# Patient Record
Sex: Male | Born: 1945 | Race: Black or African American | Hispanic: No | Marital: Married | Smoking: Former smoker
Health system: Southern US, Community
[De-identification: ages and names within clinical notes are randomized; demographics above are authoritative.]

## PROBLEM LIST (undated history)

## (undated) DIAGNOSIS — C801 Malignant (primary) neoplasm, unspecified: Secondary | ICD-10-CM

## (undated) DIAGNOSIS — R42 Dizziness and giddiness: Secondary | ICD-10-CM

## (undated) DIAGNOSIS — I10 Essential (primary) hypertension: Secondary | ICD-10-CM

---

## 2019-04-12 ENCOUNTER — Encounter: Payer: Self-pay | Admitting: *Deleted

## 2019-05-03 ENCOUNTER — Other Ambulatory Visit: Payer: Self-pay

## 2019-05-03 ENCOUNTER — Ambulatory Visit (INDEPENDENT_AMBULATORY_CARE_PROVIDER_SITE_OTHER): Payer: Self-pay | Admitting: *Deleted

## 2019-05-03 DIAGNOSIS — Z1211 Encounter for screening for malignant neoplasm of colon: Secondary | ICD-10-CM

## 2019-05-03 MED ORDER — PEG 3350-KCL-NA BICARB-NACL 420 G PO SOLR: 4000.0000 mL | Freq: Once | ORAL | 0 refills | Status: AC

## 2019-05-03 NOTE — Patient Instructions (Signed)
Joseph Norman   Nov 12, 1945 MRN: [id number]    Procedure Date: 06/30/2019 Time to register: 11:00 am Place to register: [facility] Procedure Time: 12:00 pm Scheduled provider: Dr. Gala Romney  PREPARATION FOR COLONOSCOPY WITH TRI-LYTE SPLIT PREP  Please notify us immediately if you are diabetic, take iron supplements, or if you are on Coumadin or any other blood thinners.   You will need to purchase 1 fleet enema and 1 box of Bisacodyl '5mg'$  tablets.   2 DAYS BEFORE PROCEDURE:  DATE: 06/28/2019   DAY: Monday Begin clear liquid diet AFTER your lunch meal. NO SOLID FOODS after this point.  1 DAY BEFORE PROCEDURE:  DATE: 06/29/2019  DAY: Tuesday Continue clear liquids the entire day - NO SOLID FOOD.    At 2:00 pm:  Take 2 Bisacodyl tablets.   At 4:00pm:  Start drinking your solution. Make sure you mix well per instructions on the bottle. Try to drink 1 (one) 8 ounce glass every 10-15 minutes until you have consumed HALF the jug. You should complete by 6:00pm.You must keep the left over solution refrigerated until completed next day.  Continue clear liquids. You must drink plenty of clear liquids to prevent dehyration and kidney failure.     DAY OF PROCEDURE:   DATE: 06/30/2019   DAY: Wednesday If you take medications for your heart, blood pressure or breathing, you may take these medications.    Five hours before your procedure time @ 7:00 am:  Finish remaining amout of bowel prep, drinking 1 (one) 8 ounce glass every 10-15 minutes until complete. You have two hours to consume remaining prep.   Three hours before your procedure time @ 9:00 am:  Nothing by mouth.   At least one hour before going to the hospital:  Give yourself one Fleet enema. You may take your morning medications with sip of water unless we have instructed otherwise.      Please see below for Dietary Information.  CLEAR LIQUIDS INCLUDE:  Water Jello (NOT red in color)   Ice Popsicles (NOT red in color)    Tea (sugar ok, no milk/cream) Powdered fruit flavored drinks  Coffee (sugar ok, no milk/cream) Gatorade/ Lemonade/ Kool-Aid  (NOT red in color)   Juice: apple, white grape, white cranberry Soft drinks  Clear bullion, consomme, broth (fat free beef/chicken/vegetable)  Carbonated beverages (any kind)  Strained chicken noodle soup Hard Candy   Remember: Clear liquids are liquids that will allow you to see your fingers on the other side of a clear glass. Be sure liquids are NOT red in color, and not cloudy, but CLEAR.  DO NOT EAT OR DRINK ANY OF THE FOLLOWING:  Dairy products of any kind   Cranberry juice Tomato juice / V8 juice   Grapefruit juice Orange juice     Red grape juice  Do not eat any solid foods, including such foods as: cereal, oatmeal, yogurt, fruits, vegetables, creamed soups, eggs, bread, crackers, pureed foods in a blender, etc.   HELPFUL HINTS FOR DRINKING PREP SOLUTION:   Make sure prep is extremely cold. Mix and refrigerate the the morning of the prep. You may also put in the freezer.   You may try mixing some Crystal Light or Country Time Lemonade if you prefer. Mix in small amounts; add more if necessary.  Try drinking through a straw  Rinse mouth with water or a mouthwash between glasses, to remove after-taste.  Try sipping on a cold beverage /ice/ popsicles between glasses of  prep.  Place a piece of sugar-free hard candy in mouth between glasses.  If you become nauseated, try consuming smaller amounts, or stretch out the time between glasses. Stop for 30-60 minutes, then slowly start back drinking.        OTHER INSTRUCTIONS  You will need a responsible adult at least 74 years of age to accompany you and drive you home. This person must remain in the waiting room during your procedure. The hospital will cancel your procedure if you do not have a responsible adult with you.   1. Wear loose fitting clothing that is easily removed. 2. Leave jewelry and  other valuables at home.  3. Remove all body piercing jewelry and leave at home. 4. Total time from sign-in until discharge is approximately 2-3 hours. 5. You should go home directly after your procedure and rest. You can resume normal activities the day after your procedure. 6. The day of your procedure you should not:  Drive  Make legal decisions  Operate machinery  Drink alcohol  Return to work   You may call the office (Dept: [phone]) before 5:00pm, or page the doctor on call ([phone]) after 5:00pm, for further instructions, if necessary.   Insurance Information YOU WILL NEED TO CHECK WITH YOUR INSURANCE COMPANY FOR THE BENEFITS OF COVERAGE YOU HAVE FOR THIS PROCEDURE.  UNFORTUNATELY, NOT ALL INSURANCE COMPANIES HAVE BENEFITS TO COVER ALL OR PART OF THESE TYPES OF PROCEDURES.  IT IS YOUR RESPONSIBILITY TO CHECK YOUR BENEFITS, HOWEVER, WE WILL BE GLAD TO ASSIST YOU WITH ANY CODES YOUR INSURANCE COMPANY MAY NEED.    PLEASE NOTE THAT MOST INSURANCE COMPANIES WILL NOT COVER A SCREENING COLONOSCOPY FOR PEOPLE UNDER THE AGE OF 50  IF YOU HAVE BCBS INSURANCE, YOU MAY HAVE BENEFITS FOR A SCREENING COLONOSCOPY BUT IF POLYPS ARE FOUND THE DIAGNOSIS WILL CHANGE AND THEN YOU MAY HAVE A DEDUCTIBLE THAT WILL NEED TO BE MET. SO PLEASE MAKE SURE YOU CHECK YOUR BENEFITS FOR A SCREENING COLONOSCOPY AS WELL AS A DIAGNOSTIC COLONOSCOPY.  

## 2019-05-03 NOTE — Progress Notes (Addendum)
Gastroenterology Pre-Procedure Review  Request Date: 05/03/2019 Requesting Physician: Dr. Roanna Epley Zhou-Talbert @ [facility], Last TCS 10 years or more done in [place], no polyps per pt  PATIENT REVIEW QUESTIONS: The patient responded to the following health history questions as indicated:    1. Diabetes Melitis: no 2. Joint replacements in the past 12 months: no 3. Major health problems in the past 3 months: no 4. Has an artificial valve or MVP: no 5. Has a defibrillator: no 6. Has been advised in past to take antibiotics in advance of a procedure like teeth cleaning: no 7. Family history of colon cancer: no 8. Alcohol Use: no 9. Illicit drug Use: no 10. History of sleep apnea: no  11. History of coronary artery or other vascular stents placed within the last 12 months: no 12. History of any prior anesthesia complications: no 13. There is no height or weight on file to calculate BMI. ht: 6'1 wt: 220 lbs    MEDICATIONS & ALLERGIES:    Patient reports the following regarding taking any blood thinners:   Plavix? no Aspirin? no Coumadin? no Brilinta? no Xarelto? no Eliquis? no Pradaxa? no Savaysa? no Effient? no  Patient confirms/reports the following medications:  Current Outpatient Medications  Medication Sig Dispense Refill  . Cholecalciferol (VITAMIN D3) 50 MCG (2000 UT) TABS Take by mouth.    . meclizine (ANTIVERT) 25 MG tablet Take 25 mg by mouth as needed for dizziness.    . metoprolol tartrate (LOPRESSOR) 50 MG tablet Take 50 mg by mouth daily. Takes 1/2 tablet daily.    . Omega-3 Fatty Acids (FISH OIL) 1200 MG CAPS Take by mouth daily.    . polyethylene glycol (MIRALAX / GLYCOLAX) 17 g packet Take 17 g by mouth as needed.    . Cholecalciferol (VITAMIN D) 50 MCG (2000 UT) tablet Take 1,200 Units by mouth daily.     No current facility-administered medications for this visit.     Patient confirms/reports the following allergies:  Allergies   Allergen Reactions  . Asa [Aspirin] Other (See Comments)    Stomach pain    No orders of the defined types were placed in this encounter.   AUTHORIZATION INFORMATION Primary Insurance: [employer] Medicare,  ID [id number] ,  Group #: [id number] Pre-Cert / Auth required: No, not required  SCHEDULE INFORMATION: Procedure has been scheduled as follows:  Date: 06/30/2019, Time: 12:00 Location: [facility] with Dr. Gala Romney  This Gastroenterology Pre-Precedure Review Form is being routed to the following provider(s): Neil Crouch, PA-C

## 2019-05-04 NOTE — Progress Notes (Signed)
Ok to schedule.

## 2019-05-05 NOTE — Addendum Note (Signed)
Addended by: Metro Kung on: 05/05/2019 07:32 AM   Modules accepted: Orders, SmartSet

## 2019-06-28 ENCOUNTER — Other Ambulatory Visit (HOSPITAL_COMMUNITY)
Admission: RE | Admit: 2019-06-28 | Discharge: 2019-06-28 | Disposition: A | Discharge status: Home / Self Care | Payer: Medicare Other | Source: Ambulatory Visit | Attending: Internal Medicine | Admitting: Internal Medicine

## 2019-06-28 ENCOUNTER — Other Ambulatory Visit: Payer: Self-pay

## 2019-06-28 DIAGNOSIS — Z20828 Contact with and (suspected) exposure to other viral communicable diseases: Secondary | ICD-10-CM | POA: Diagnosis not present

## 2019-06-28 DIAGNOSIS — Z01812 Encounter for preprocedural laboratory examination: Secondary | ICD-10-CM | POA: Diagnosis present

## 2019-06-28 LAB — SARS CORONAVIRUS 2 (TAT 6-24 HRS): SARS Coronavirus 2: NEGATIVE

## 2019-06-30 ENCOUNTER — Encounter (HOSPITAL_COMMUNITY)
Admission: RE | Disposition: A | Discharge status: Home / Self Care | Payer: Self-pay | Source: Home / Self Care | Attending: Internal Medicine

## 2019-06-30 ENCOUNTER — Other Ambulatory Visit: Payer: Self-pay

## 2019-06-30 ENCOUNTER — Encounter (HOSPITAL_COMMUNITY): Payer: Self-pay | Admitting: *Deleted

## 2019-06-30 ENCOUNTER — Ambulatory Visit (HOSPITAL_COMMUNITY)
Admission: RE | Admit: 2019-06-30 | Discharge: 2019-06-30 | Disposition: A | Discharge status: Home / Self Care | Payer: Medicare Other | Attending: Internal Medicine | Admitting: Internal Medicine

## 2019-06-30 DIAGNOSIS — K5730 Diverticulosis of large intestine without perforation or abscess without bleeding: Secondary | ICD-10-CM | POA: Insufficient documentation

## 2019-06-30 DIAGNOSIS — I10 Essential (primary) hypertension: Secondary | ICD-10-CM | POA: Diagnosis not present

## 2019-06-30 DIAGNOSIS — Z8546 Personal history of malignant neoplasm of prostate: Secondary | ICD-10-CM | POA: Diagnosis not present

## 2019-06-30 DIAGNOSIS — Z87891 Personal history of nicotine dependence: Secondary | ICD-10-CM | POA: Insufficient documentation

## 2019-06-30 DIAGNOSIS — Z1211 Encounter for screening for malignant neoplasm of colon: Secondary | ICD-10-CM

## 2019-06-30 HISTORY — DX: Dizziness and giddiness: R42

## 2019-06-30 HISTORY — DX: Essential (primary) hypertension: I10

## 2019-06-30 HISTORY — PX: COLONOSCOPY: SHX5424

## 2019-06-30 HISTORY — DX: Malignant (primary) neoplasm, unspecified: C80.1

## 2019-06-30 SURGERY — COLONOSCOPY
Anesthesia: Moderate Sedation

## 2019-06-30 MED ORDER — ONDANSETRON HCL 4 MG/2ML IJ SOLN
INTRAMUSCULAR | Status: DC | PRN
  Administered 2019-06-30: 4 mg via INTRAVENOUS

## 2019-06-30 MED ORDER — MEPERIDINE HCL 50 MG/ML IJ SOLN
INTRAMUSCULAR | Status: AC
  Filled 2019-06-30: qty 1

## 2019-06-30 MED ORDER — MIDAZOLAM HCL 5 MG/5ML IJ SOLN
INTRAMUSCULAR | Status: AC
  Filled 2019-06-30: qty 10

## 2019-06-30 MED ORDER — MEPERIDINE HCL 100 MG/ML IJ SOLN
INTRAMUSCULAR | Status: DC | PRN
  Administered 2019-06-30: 25 mg via INTRAVENOUS

## 2019-06-30 MED ORDER — ONDANSETRON HCL 4 MG/2ML IJ SOLN
INTRAMUSCULAR | Status: AC
  Filled 2019-06-30: qty 2

## 2019-06-30 MED ORDER — MIDAZOLAM HCL 5 MG/5ML IJ SOLN
INTRAMUSCULAR | Status: DC | PRN
  Administered 2019-06-30 (×2): 1 mg via INTRAVENOUS
  Administered 2019-06-30: 2 mg via INTRAVENOUS

## 2019-06-30 MED ORDER — SODIUM CHLORIDE 0.9 % IV SOLN
INTRAVENOUS | Status: DC
  Administered 2019-06-30: 11:00:00 via INTRAVENOUS

## 2019-06-30 NOTE — H&P (Signed)
@LOGO @   Primary Care Physician:  Zhou-Talbert, Elwyn Lade, MD Primary Gastroenterologist:  Dr. Gala Romney  Pre-Procedure History & Physical: HPI:  Joseph Norman is a 73 y.o. male is here for a screening colonoscopy.  Negative colonoscopy reportedly in [place] 10 years ago.  No bowel symptoms.  Past Medical History:  Diagnosis Date  . Cancer [facility])    Prostate  . Dizziness   . Hypertension     Prior to Admission medications   Medication Sig Start Date End Date Taking? Authorizing Provider  acetaminophen (TYLENOL) 500 MG tablet Take 500 mg by mouth every 6 (six) hours as needed (for pain/headaches.).   Yes [provider]  Cholecalciferol (VITAMIN D3) 50 MCG (2000 UT) TABS Take 2,000 Units by mouth daily.    Yes [provider]  hydrocortisone cream 1 % Apply 1 application topically 3 (three) times daily as needed for itching.    Yes [provider]  meclizine (ANTIVERT) 25 MG tablet Take 25 mg by mouth 3 (three) times daily as needed for dizziness.    Yes [provider]  metoprolol tartrate (LOPRESSOR) 50 MG tablet Take 25 mg by mouth daily.    Yes [provider]  Omega-3 Fatty Acids (FISH OIL) 1200 MG CAPS Take 1,200 mg by mouth daily.    Yes [provider]  polyethylene glycol (MIRALAX / GLYCOLAX) 17 g packet Take 17 g by mouth daily as needed (constipation.).    Yes [provider]    Allergies as of 05/05/2019 - never reviewed  Allergen Reaction Noted  . Asa [aspirin] Other (See Comments) 05/03/2019    Family History  Problem Relation Age of Onset  . Colon cancer Neg Hx     Social History   Socioeconomic History  . Marital status: Married    Spouse name: Not on file  . Number of children: Not on file  . Years of education: Not on file  . Highest education level: Not on file  Occupational History  . Not on file  Social Needs  . Financial resource strain: Not on file  . Food insecurity    Worry: Not  on file    Inability: Not on file  . Transportation needs    Medical: Not on file    Non-medical: Not on file  Tobacco Use  . Smoking status: Former Research scientist (life sciences)  . Smokeless tobacco: Never Used  Substance and Sexual Activity  . Alcohol use: Not Currently  . Drug use: Not Currently  . Sexual activity: Not on file  Lifestyle  . Physical activity    Days per week: Not on file    Minutes per session: Not on file  . Stress: Not on file  Relationships  . Social Herbalist on phone: Not on file    Gets together: Not on file    Attends religious service: Not on file    Active member of club or organization: Not on file    Attends meetings of clubs or organizations: Not on file    Relationship status: Not on file  . Intimate partner violence    Fear of current or ex partner: Not on file    Emotionally abused: Not on file    Physically abused: Not on file    Forced sexual activity: Not on file  Other Topics Concern  . Not on file  Social History Narrative  . Not on file    Review of Systems: See HPI, otherwise negative ROS  Physical Exam: There were no vitals taken for this visit. General:   Alert,  Well-developed, well-nourished, pleasant and cooperative in NAD Lungs:  Clear throughout to auscultation.   No wheezes, crackles, or rhonchi. No acute distress. Heart:  Regular rate and rhythm; no murmurs, clicks, rubs,  or gallops. Abdomen:  Soft, nontender and nondistended. No masses, hepatosplenomegaly or hernias noted. Normal bowel sounds, without guarding, and without rebound.    Impression/Plan: Joseph Norman is now here to undergo a screening colonoscopy.  Risks, benefits, limitations, imponderables and alternatives regarding colonoscopy have been reviewed with the patient. Questions have been answered. All parties agreeable.     Notice:  This dictation was prepared with Dragon dictation along with smaller phrase technology. Any transcriptional errors that result  from this process are unintentional and may not be corrected upon review.

## 2019-06-30 NOTE — Discharge Instructions (Signed)
Colonoscopy Discharge Instructions  Read the instructions outlined below and refer to this sheet in the next few weeks. These discharge instructions provide you with general information on caring for yourself after you leave the hospital. Your doctor may also give you specific instructions. While your treatment has been planned according to the most current medical practices available, unavoidable complications occasionally occur. If you have any problems or questions after discharge, call Dr. Gala Romney at [phone]. ACTIVITY  You may resume your regular activity, but move at a slower pace for the next 24 hours.   Take frequent rest periods for the next 24 hours.   Walking will help get rid of the air and reduce the bloated feeling in your belly (abdomen).   No driving for 24 hours (because of the medicine (anesthesia) used during the test).    Do not sign any important legal documents or operate any machinery for 24 hours (because of the anesthesia used during the test).  NUTRITION  Drink plenty of fluids.   You may resume your normal diet as instructed by your doctor.   Begin with a light meal and progress to your normal diet. Heavy or fried foods are harder to digest and may make you feel sick to your stomach (nauseated).   Avoid alcoholic beverages for 24 hours or as instructed.  MEDICATIONS  You may resume your normal medications unless your doctor tells you otherwise.  WHAT YOU CAN EXPECT TODAY  Some feelings of bloating in the abdomen.   Passage of more gas than usual.   Spotting of blood in your stool or on the toilet paper.  IF YOU HAD POLYPS REMOVED DURING THE COLONOSCOPY:  No aspirin products for 7 days or as instructed.   No alcohol for 7 days or as instructed.   Eat a soft diet for the next 24 hours.  FINDING OUT THE RESULTS OF YOUR TEST Not all test results are available during your visit. If your test results are not back during the visit, make an appointment  with your caregiver to find out the results. Do not assume everything is normal if you have not heard from your caregiver or the medical facility. It is important for you to follow up on all of your test results.  SEEK IMMEDIATE MEDICAL ATTENTION IF:  You have more than a spotting of blood in your stool.   Your belly is swollen (abdominal distention).   You are nauseated or vomiting.   You have a temperature over 101.   You have abdominal pain or discomfort that is severe or gets worse throughout the day.   Diverticulosis information provided  I do not recommend a future colonoscopy unless new symptoms develop  At patient's request I called Laython Kirtz, wife, it [phone].  Rolled to voicemail.  I did not leave a message.   Diverticulosis  Diverticulosis is a condition that develops when small pouches (diverticula) form in the wall of the large intestine (colon). The colon is where water is absorbed and stool is formed. The pouches form when the inside layer of the colon pushes through weak spots in the outer layers of the colon. You may have a few pouches or many of them. What are the causes? The cause of this condition is not known. What increases the risk? The following factors may make you more likely to develop this condition:  Being older than age 45. Your risk for this condition increases with age. Diverticulosis is rare among people younger than  age 35. By age 24, many people have it.  Eating a low-fiber diet.  Having frequent constipation.  Being overweight.  Not getting enough exercise.  Smoking.  Taking over-the-counter pain medicines, like aspirin and ibuprofen.  Having a family history of diverticulosis. What are the signs or symptoms? In most people, there are no symptoms of this condition. If you do have symptoms, they may include:  Bloating.  Cramps in the abdomen.  Constipation or diarrhea.  Pain in the lower left side of the abdomen. How  is this diagnosed? This condition is most often diagnosed during an exam for other colon problems. Because diverticulosis usually has no symptoms, it often cannot be diagnosed independently. This condition may be diagnosed by:  Using a flexible scope to examine the colon (colonoscopy).  Taking an X-ray of the colon after dye has been put into the colon (barium enema).  Doing a CT scan. How is this treated? You may not need treatment for this condition if you have never developed an infection related to diverticulosis. If you have had an infection before, treatment may include:  Eating a high-fiber diet. This may include eating more fruits, vegetables, and grains.  Taking a fiber supplement.  Taking a live bacteria supplement (probiotic).  Taking medicine to relax your colon.  Taking antibiotic medicines. Follow these instructions at home:  Drink 6-8 glasses of water or more each day to prevent constipation.  Try not to strain when you have a bowel movement.  If you have had an infection before: ? Eat more fiber as directed by your health care provider or your diet and nutrition specialist (dietitian). ? Take a fiber supplement or probiotic, if your health care provider approves.  Take over-the-counter and prescription medicines only as told by your health care provider.  If you were prescribed an antibiotic, take it as told by your health care provider. Do not stop taking the antibiotic even if you start to feel better.  Keep all follow-up visits as told by your health care provider. This is important. Contact a health care provider if:  You have pain in your abdomen.  You have bloating.  You have cramps.  You have not had a bowel movement in 3 days. Get help right away if:  Your pain gets worse.  Your bloating becomes very bad.  You have a fever or chills, and your symptoms suddenly get worse.  You vomit.  You have bowel movements that are bloody or  black.  You have bleeding from your rectum. Summary  Diverticulosis is a condition that develops when small pouches (diverticula) form in the wall of the large intestine (colon).  You may have a few pouches or many of them.  This condition is most often diagnosed during an exam for other colon problems.  If you have had an infection related to diverticulosis, treatment may include increasing the fiber in your diet, taking supplements, or taking medicines. This information is not intended to replace advice given to you by your health care provider. Make sure you discuss any questions you have with your health care provider. Document Released: 04/11/2004 Document Revised: 06/27/2017 Document Reviewed: 06/03/2016 Elsevier Patient Education  2020 [employer].

## 2019-06-30 NOTE — Op Note (Signed)
[facility] Patient Name: Joseph Norman Procedure Date: 06/30/2019 10:46 AM MRN: [id number] Date of Birth: 01/09/1946 Attending MD: Norvel Richards , MD CSN: [id number] Age: 73 Admit Type: Outpatient Procedure:                Colonoscopy Indications:              Screening for colorectal malignant neoplasm Providers:                Norvel Richards, MD, Charlsie Quest. Theda Sers RN, RN,                            Aram Candela Referring MD:              Medicines:                Midazolam 4 mg IV, Meperidine 25 mg IV Complications:            No immediate complications. Estimated Blood Loss:     Estimated blood loss: none. Procedure:                Pre-Anesthesia Assessment:                           - Prior to the procedure, a History and Physical                            was performed, and patient medications and                            allergies were reviewed. The patient's tolerance of                            previous anesthesia was also reviewed. The risks                            and benefits of the procedure and the sedation                            options and risks were discussed with the patient.                            All questions were answered, and informed consent                            was obtained. Prior Anticoagulants: The patient has                            taken no previous anticoagulant or antiplatelet                            agents. ASA Grade Assessment: II - A patient with                            mild systemic disease. After reviewing the risks  and benefits, the patient was deemed in                            satisfactory condition to undergo the procedure.                           After obtaining informed consent, the colonoscope                            was passed under direct vision. Throughout the                            procedure, the patient's blood pressure, pulse, and                             oxygen saturations were monitored continuously. The                            [id number] scope was introduced through                            the anus and advanced to the the cecum, identified                            by appendiceal orifice and ileocecal valve. The                            colonoscopy was performed without difficulty. The                            patient tolerated the procedure well. The quality                            of the bowel preparation was adequate. Scope In: 11:16:03 AM Scope Out: 11:31:19 AM Scope Withdrawal Time: 0 hours 6 minutes 56 seconds  Total Procedure Duration: 0 hours 15 minutes 16 seconds  Findings:      The perianal and digital rectal examinations were normal.      Scattered medium-mouthed diverticula were found in the ascending colon.       There was no evidence of diverticular bleeding.      The exam was otherwise without abnormality on direct and retroflexion       views. Impression:               - Diverticulosis in the ascending colon. There was                            no evidence of diverticular bleeding.                           - The examination was otherwise normal on direct                            and retroflexion views.                           -  No specimens collected. Moderate Sedation:      Moderate (conscious) sedation was administered by the endoscopy nurse       and supervised by the endoscopist. The following parameters were       monitored: oxygen saturation, heart rate, blood pressure, respiratory       rate, EKG, adequacy of pulmonary ventilation, and response to care.       Total physician intraservice time was 19 minutes. Recommendation:           - Patient has a contact number available for                            emergencies. The signs and symptoms of potential                            delayed complications were discussed with the                            patient. Return to  normal activities tomorrow.                            Written discharge instructions were provided to the                            patient.                           - Resume previous diet.                           - Continue present medications.                           - No repeat colonoscopy due to age.                           - Return to GI clinic PRN. Procedure Code(s):        --- Professional ---                           [phone], Colonoscopy, flexible; diagnostic, including                            collection of specimen(s) by brushing or washing,                            when performed (separate procedure)                           G0500, Moderate sedation services provided by the                            same physician or other qualified health care                            professional performing a gastrointestinal  endoscopic service that sedation supports,                            requiring the presence of an independent trained                            observer to assist in the monitoring of the                            patient's level of consciousness and physiological                            status; initial 15 minutes of intra-service time;                            patient age 63 years or older (additional time may                            be reported with [phone], as appropriate) Diagnosis Code(s):        --- Professional ---                           Z12.11, Encounter for screening for malignant                            neoplasm of colon                           K57.30, Diverticulosis of large intestine without                            perforation or abscess without bleeding CPT copyright 2019 [employer]. All rights reserved. The codes documented in this report are preliminary and upon coder review may  be revised to meet current compliance requirements. Cristopher Estimable. Rourk, MD Norvel Richards,  MD 06/30/2019 11:37:26 AM This report has been signed electronically. Number of Addenda: 0

## 2019-07-02 ENCOUNTER — Encounter (HOSPITAL_COMMUNITY): Payer: Self-pay | Admitting: Internal Medicine

## 2020-12-07 ENCOUNTER — Other Ambulatory Visit: Payer: Self-pay

## 2020-12-07 ENCOUNTER — Emergency Department (HOSPITAL_COMMUNITY)
Admission: EM | Admit: 2020-12-07 | Discharge: 2020-12-07 | Disposition: A | Discharge status: Home / Self Care | Payer: Medicare Other | Attending: Emergency Medicine | Admitting: Emergency Medicine

## 2020-12-07 ENCOUNTER — Encounter (HOSPITAL_COMMUNITY): Payer: Self-pay

## 2020-12-07 DIAGNOSIS — Z87891 Personal history of nicotine dependence: Secondary | ICD-10-CM | POA: Insufficient documentation

## 2020-12-07 DIAGNOSIS — I10 Essential (primary) hypertension: Secondary | ICD-10-CM | POA: Insufficient documentation

## 2020-12-07 DIAGNOSIS — Z8546 Personal history of malignant neoplasm of prostate: Secondary | ICD-10-CM | POA: Insufficient documentation

## 2020-12-07 DIAGNOSIS — M545 Low back pain, unspecified: Secondary | ICD-10-CM | POA: Diagnosis present

## 2020-12-07 DIAGNOSIS — M5440 Lumbago with sciatica, unspecified side: Secondary | ICD-10-CM

## 2020-12-07 DIAGNOSIS — M5441 Lumbago with sciatica, right side: Secondary | ICD-10-CM | POA: Insufficient documentation

## 2020-12-07 DIAGNOSIS — Z79899 Other long term (current) drug therapy: Secondary | ICD-10-CM | POA: Insufficient documentation

## 2020-12-07 MED ORDER — OXYCODONE-ACETAMINOPHEN 5-325 MG PO TABS
1.0000 | ORAL_TABLET | Freq: Once | ORAL | Status: AC
  Administered 2020-12-07: 1 via ORAL
  Filled 2020-12-07: qty 1

## 2020-12-07 MED ORDER — OXYCODONE-ACETAMINOPHEN 5-325 MG PO TABS: 1.0000 | ORAL_TABLET | Freq: Four times a day (QID) | ORAL | 0 refills | Status: AC | PRN

## 2020-12-07 NOTE — ED Notes (Signed)
Pt verbalized he did not take his bp meds today

## 2020-12-07 NOTE — ED Provider Notes (Signed)
[facility] EMERGENCY DEPARTMENT Provider Note   CSN: [id number] Arrival date & time: 12/07/20  1540     History Chief Complaint  Patient presents with  . Back Pain    Joseph Norman is a 75 y.o. male.  Patient complains of low back pain radiating down right leg.  He was started on prednisone and given a shot yesterday.  Patient states the pain was much worse when he got here but by the time he was seen by the provider and pain was only minimal.  The history is provided by the patient and medical records. No language interpreter was used.  Back Pain Location:  Lumbar spine Quality:  Aching Radiates to: Right leg. Pain severity:  Moderate Pain is:  Worse during the day Onset quality:  Sudden Timing:  Constant Progression:  Worsening Chronicity:  New Context: not emotional stress   Relieved by:  Nothing Worsened by:  Nothing Associated symptoms: no abdominal pain, no chest pain and no headaches        Past Medical History:  Diagnosis Date  . Cancer [facility])    Prostate  . Dizziness   . Hypertension     There are no problems to display for this patient.   Past Surgical History:  Procedure Laterality Date  . COLONOSCOPY N/A 06/30/2019   Procedure: COLONOSCOPY;  Surgeon: Daneil Dolin, MD;  Location[facility];  Service: Endoscopy;  Laterality: N/A;  12:00  . HEMORRHOID SURGERY    . NECK SURGERY    . PROSTATE SURGERY         Family History  Problem Relation Age of Onset  . Colon cancer Neg Hx     Social History   Tobacco Use  . Smoking status: Former Research scientist (life sciences)  . Smokeless tobacco: Never Used  Vaping Use  . Vaping Use: Never used  Substance Use Topics  . Alcohol use: Not Currently  . Drug use: Not Currently    Home Medications Prior to Admission medications   Medication Sig Start Date End Date Taking? Authorizing Provider  acetaminophen (TYLENOL) 650 MG CR tablet Take 1,300 mg by mouth every 8 (eight) hours as needed for pain (pain/headache).    Yes [provider]  Cholecalciferol (VITAMIN D3) 50 MCG (2000 UT) TABS Take 2,000 Units by mouth daily.    Yes [provider]  hydrocortisone cream 1 % Apply 1 application topically daily as needed for itching (for chest).   Yes [provider]  meclizine (ANTIVERT) 25 MG tablet Take 25 mg by mouth 3 (three) times daily as needed for dizziness.    Yes [provider]  metoprolol succinate (TOPROL-XL) 25 MG 24 hr tablet Take 1 tablet by mouth daily. 11/07/20  Yes [provider]  Omega-3 Fatty Acids (FISH OIL) 1200 MG CAPS Take 1,200 mg by mouth daily.    Yes [provider]  oxyCODONE-acetaminophen (PERCOCET) 5-325 MG tablet Take 1 tablet by mouth every 6 (six) hours as needed. 12/07/20  Yes Milton Ferguson, MD  polyethylene glycol (MIRALAX / GLYCOLAX) 17 g packet Take 17 g by mouth daily as needed (constipation.).    Yes [provider]  predniSONE (DELTASONE) 20 MG tablet Take 20 mg by mouth 2 (two) times daily. 1 tab twice daily x5 days 12/04/20  Yes [provider]  acetaminophen (TYLENOL) 500 MG tablet Take 1,000 mg by mouth every 6 (six) hours as needed (for pain/headaches.). Patient not taking: Reported on 12/07/2020    [provider]  metoprolol tartrate (LOPRESSOR) 50 MG tablet Take 25 mg by mouth daily.  Patient not taking: No sig reported    [provider]    Allergies    Asa [aspirin]  Review of Systems   Review of Systems  Constitutional: Negative for appetite change and fatigue.  HENT: Negative for congestion, ear discharge and sinus pressure.   Eyes: Negative for discharge.  Respiratory: Negative for cough.   Cardiovascular: Negative for chest pain.  Gastrointestinal: Negative for abdominal pain and diarrhea.  Genitourinary: Negative for frequency and hematuria.  Musculoskeletal: Positive for back pain.  Skin: Negative for rash.  Neurological: Negative for seizures and headaches.   Psychiatric/Behavioral: Negative for hallucinations.    Physical Exam Updated Vital Signs BP 105/70   Pulse (!) 54   Temp 98.3 F (36.8 C) (Oral)   Resp 16   Ht 6' (1.829 m)   Wt 97.5 kg   SpO2 96%   BMI 29.16 kg/m   Physical Exam Vitals and nursing note reviewed.  Constitutional:      Appearance: He is well-developed.  HENT:     Head: Normocephalic.     Nose: Nose normal.  Eyes:     General: No scleral icterus.    Conjunctiva/sclera: Conjunctivae normal.  Neck:     Thyroid: No thyromegaly.  Cardiovascular:     Rate and Rhythm: Normal rate and regular rhythm.     Heart sounds: No murmur heard. No friction rub. No gallop.   Pulmonary:     Breath sounds: No stridor. No wheezing or rales.  Chest:     Chest wall: No tenderness.  Abdominal:     General: There is no distension.     Tenderness: There is no abdominal tenderness. There is no rebound.  Musculoskeletal:     Cervical back: Neck supple.     Comments: Tender lumbar spine with straight leg raise on the right  Lymphadenopathy:     Cervical: No cervical adenopathy.  Skin:    Findings: No erythema or rash.  Neurological:     Mental Status: He is alert and oriented to person, place, and time.     Motor: No abnormal muscle tone.     Coordination: Coordination normal.  Psychiatric:        Behavior: Behavior normal.     ED Results / Procedures / Treatments   Labs (all labs ordered are listed, but only abnormal results are displayed) Labs Reviewed - No data to display  EKG None  Radiology No results found.  Procedures Procedures   Medications Ordered in ED Medications  oxyCODONE-acetaminophen (PERCOCET/ROXICET) 5-325 MG per tablet 1 tablet (1 tablet Oral Given 12/07/20 1205)    ED Course  I have reviewed the triage vital signs and the nursing notes.  Pertinent labs & imaging results that were available during my care of the patient were reviewed by me and considered in my medical decision making  (see chart for details).    MDM Rules/Calculators/A&P                          Patient with sciatica back pain on the right.  He will take his prednisone and is given some pain medicine will follow up with his doctor Final Clinical Impression(s) / ED Diagnoses Final diagnoses:  Acute right-sided low back pain with sciatica, sciatica laterality unspecified    Rx / DC Orders ED Discharge Orders         Ordered  oxyCODONE-acetaminophen (PERCOCET) 5-325 MG tablet  Every 6 hours PRN        12/07/20 1322           Milton Ferguson, MD 12/08/20 1705

## 2020-12-07 NOTE — Discharge Instructions (Addendum)
Make sure you take your prednisone as prescribed and follow-up with your doctor as needed

## 2020-12-07 NOTE — ED Triage Notes (Signed)
Pt presents to ED via [facility] for lower back pain radiating to right leg. Pt received shot on Monday.

## 2020-12-11 ENCOUNTER — Emergency Department (HOSPITAL_COMMUNITY): Payer: Medicare Other

## 2020-12-11 ENCOUNTER — Other Ambulatory Visit: Payer: Self-pay

## 2020-12-11 ENCOUNTER — Encounter (HOSPITAL_COMMUNITY): Payer: Self-pay | Admitting: Emergency Medicine

## 2020-12-11 ENCOUNTER — Emergency Department (HOSPITAL_COMMUNITY)
Admission: EM | Admit: 2020-12-11 | Discharge: 2020-12-11 | Disposition: A | Discharge status: Home / Self Care | Payer: Medicare Other | Attending: Emergency Medicine | Admitting: Emergency Medicine

## 2020-12-11 DIAGNOSIS — Z79899 Other long term (current) drug therapy: Secondary | ICD-10-CM | POA: Diagnosis not present

## 2020-12-11 DIAGNOSIS — M5431 Sciatica, right side: Secondary | ICD-10-CM | POA: Diagnosis not present

## 2020-12-11 DIAGNOSIS — Z87891 Personal history of nicotine dependence: Secondary | ICD-10-CM | POA: Insufficient documentation

## 2020-12-11 DIAGNOSIS — Z8546 Personal history of malignant neoplasm of prostate: Secondary | ICD-10-CM | POA: Diagnosis not present

## 2020-12-11 DIAGNOSIS — I10 Essential (primary) hypertension: Secondary | ICD-10-CM | POA: Insufficient documentation

## 2020-12-11 DIAGNOSIS — R55 Syncope and collapse: Secondary | ICD-10-CM | POA: Diagnosis not present

## 2020-12-11 NOTE — ED Triage Notes (Signed)
Pt to the Ed via [facility] from home with hypertension.  Pt also says he has sciatica pain on the right side and has been treated here for there same.

## 2020-12-11 NOTE — ED Notes (Signed)
Pt ambulated from room to bathroom and back. Pt gait steady . Pt states no dizziness

## 2020-12-11 NOTE — ED Notes (Signed)
Pt left with wife prior to signing e-signature.

## 2020-12-11 NOTE — ED Provider Notes (Signed)
[facility] EMERGENCY DEPARTMENT Provider Note   CSN: [id number] Arrival date & time: 12/11/20  1643     History Chief Complaint  Patient presents with  . Hypertension    Joseph Norman is a 75 y.o. male.  HPI He resents for evaluation of right-sided sciatica symptoms present for about a week, initially treated by PCP with "a shot and some pills."  Additionally seen in the ED, 4 days ago at that time added oxycodone for pain.  He is taking oxycodone without relief of his discomfort.  Today he was sitting on the porch, and noticed that he had some pain in his right buttocks.  At that time he felt hot and sweaty and felt like he would pass out.  This feeling was transient.  He denies bowel or bladder incontinence.  He denies fever, chills, nausea, vomiting, weakness or dizziness.  He is not losing weight.  He has had prostate cancer in the past apparently treated with radiation, and no known metastatic disease.  There are no other known active modifying factors.    Past Medical History:  Diagnosis Date  . Cancer [facility])    Prostate  . Dizziness   . Hypertension     There are no problems to display for this patient.   Past Surgical History:  Procedure Laterality Date  . COLONOSCOPY N/A 06/30/2019   Procedure: COLONOSCOPY;  Surgeon: Daneil Dolin, MD;  Location[facility];  Service: Endoscopy;  Laterality: N/A;  12:00  . HEMORRHOID SURGERY    . NECK SURGERY    . PROSTATE SURGERY         Family History  Problem Relation Age of Onset  . Colon cancer Neg Hx     Social History   Tobacco Use  . Smoking status: Former Research scientist (life sciences)  . Smokeless tobacco: Never Used  Vaping Use  . Vaping Use: Never used  Substance Use Topics  . Alcohol use: Not Currently  . Drug use: Not Currently    Home Medications Prior to Admission medications   Medication Sig Start Date End Date Taking? Authorizing Provider  acetaminophen (TYLENOL) 500 MG tablet Take 1,000 mg by mouth every 6 (six)  hours as needed (for pain/headaches.). Patient not taking: Reported on 12/07/2020    [provider]  acetaminophen (TYLENOL) 650 MG CR tablet Take 1,300 mg by mouth every 8 (eight) hours as needed for pain (pain/headache).    [provider]  Cholecalciferol (VITAMIN D3) 50 MCG (2000 UT) TABS Take 2,000 Units by mouth daily.     [provider]  hydrocortisone cream 1 % Apply 1 application topically daily as needed for itching (for chest).    [provider]  meclizine (ANTIVERT) 25 MG tablet Take 25 mg by mouth 3 (three) times daily as needed for dizziness.     [provider]  metoprolol succinate (TOPROL-XL) 25 MG 24 hr tablet Take 1 tablet by mouth daily. 11/07/20   [provider]  metoprolol tartrate (LOPRESSOR) 50 MG tablet Take 25 mg by mouth daily.  Patient not taking: No sig reported    [provider]  Omega-3 Fatty Acids (FISH OIL) 1200 MG CAPS Take 1,200 mg by mouth daily.     [provider]  oxyCODONE-acetaminophen (PERCOCET) 5-325 MG tablet Take 1 tablet by mouth every 6 (six) hours as needed. 12/07/20   Milton Ferguson, MD  polyethylene glycol (MIRALAX / GLYCOLAX) 17 g packet Take 17 g by mouth daily as needed (constipation.).  [provider]  predniSONE (DELTASONE) 20 MG tablet Take 20 mg by mouth 2 (two) times daily. 1 tab twice daily x5 days 12/04/20   [provider]    Allergies    Asa [aspirin]  Review of Systems   Review of Systems  All other systems reviewed and are negative.   Physical Exam Updated Vital Signs BP (!) 143/87 (BP Location: Right Arm)   Pulse 64   Temp 98 F (36.7 C) (Oral)   Resp 17   Ht 6' (1.829 m)   Wt 97.5 kg   SpO2 100%   BMI 29.16 kg/m   Physical Exam Vitals and nursing note reviewed.  Constitutional:      General: He is not in acute distress.    Appearance: He is well-developed. He is not ill-appearing, toxic-appearing or diaphoretic.  HENT:      Head: Normocephalic and atraumatic.     Right Ear: External ear normal.     Left Ear: External ear normal.  Eyes:     Conjunctiva/sclera: Conjunctivae normal.     Pupils: Pupils are equal, round, and reactive to light.  Neck:     Trachea: Phonation normal.  Cardiovascular:     Rate and Rhythm: Normal rate.  Pulmonary:     Effort: Pulmonary effort is normal.  Abdominal:     General: There is no distension.     Palpations: Abdomen is soft.  Musculoskeletal:        General: Normal range of motion.     Cervical back: Normal range of motion and neck supple.     Comments: Mild tenderness, right lumbar and right buttock region.  No constrained range of motion either actively or passively.  Skin:    General: Skin is warm and dry.  Neurological:     Mental Status: He is alert and oriented to person, place, and time.     Cranial Nerves: No cranial nerve deficit.     Sensory: No sensory deficit.     Motor: No abnormal muscle tone.     Coordination: Coordination normal.  Psychiatric:        Mood and Affect: Mood normal.        Behavior: Behavior normal.        Thought Content: Thought content normal.        Judgment: Judgment normal.     ED Results / Procedures / Treatments   Labs (all labs ordered are listed, but only abnormal results are displayed) Labs Reviewed - No data to display  EKG None  Radiology No results found.  Procedures Procedures   Medications Ordered in ED Medications - No data to display  ED Course  I have reviewed the triage vital signs and the nursing notes.  Pertinent labs & imaging results that were available during my care of the patient were reviewed by me and considered in my medical decision making (see chart for details).    MDM Rules/Calculators/A&P                           Patient Vitals for the past 24 hrs:  BP Temp Temp src Pulse Resp SpO2 Height Weight  12/11/20 2000 118/75 -- -- 62 18 100 % -- --  12/11/20 1900 (!) 142/87 --  -- 64 16 99 % -- --  12/11/20 1800 137/85 -- -- 60 16 97 % -- --  12/11/20 1648 -- -- -- -- -- -- 6' (1.829 m)  97.5 kg  12/11/20 1647 (!) 143/87 98 F (36.7 C) Oral 64 17 100 % -- --    9:43 PM Reevaluation with update and discussion. After initial assessment and treatment, an updated evaluation reveals he is comfortable has had no additional episodes of near syncope.  Patient's wife here now and I explained the findings to the patient and her and all questions were answered. Daleen Bo   Medical Decision Making:  This patient is presenting for evaluation of sciatica and near syncope, which does require a range of treatment options, and is a complaint that involves a moderate risk of morbidity and mortality. The differential diagnoses include vasovagal episode, cardiac disorder, worsening radiculopathy. I decided to review old records, and in summary Ehly male presenting with short period of near-syncope associated with pain full sciatica.  I did not require additional historical information from anyone.    Radiologic Tests Ordered, included x-ray pelvis and lumbar spine.  I independently Visualized: Radiograph images, which show no evidence of lytic lesions/metastatic disease   Critical Interventions-clinical evaluation, radiography  After These Interventions, the Patient was reevaluated and was found improved and stable for discharge.  Patient with signs and symptoms of sciatica.  He had transient near syncope today, likely related to pain.  Screening imaging done with plain films of lumbar spine and pelvis did not indicate lytic lesions, as a source for pain.  Note that he is "recovered," from prostate cancer.  Doubt spinal myelopathy.  Doubt unstable cardiac condition.  Doubt volume depletion or metabolic disorder.  CRITICAL CARE-no Performed by: Daleen Bo  Nursing Notes Reviewed/ Care Coordinated Applicable Imaging Reviewed Interpretation of Laboratory Data incorporated into  ED treatment  The patient appears reasonably screened and/or stabilized for discharge and I doubt any other medical condition or other [facility] requiring further screening, evaluation, or treatment in the ED at this time prior to discharge.  Plan: Home Medications-continue usual; Home Treatments-heat to affected area; return here if the recommended treatment, does not improve the symptoms; Recommended follow up-PCP if not better in 1 week     Final Clinical Impression(s) / ED Diagnoses Final diagnoses:  Sciatica of right side  Near syncope    Rx / DC Orders ED Discharge Orders    None       Daleen Bo, MD 12/12/20 [phone]

## 2020-12-11 NOTE — Discharge Instructions (Addendum)
Continue to treat your sciatica with prednisone or ibuprofen.  Use heat on the sore area 3 or 4 times a day.  The episode that he had earlier today likely was near syncope, a period of dropping blood pressure.  Make sure you are getting plenty of rest, and drink a lot of fluids.  See your doctor if not improving.  Return here if needed.

## 2021-05-31 ENCOUNTER — Other Ambulatory Visit (HOSPITAL_COMMUNITY): Payer: Self-pay | Admitting: Neurology

## 2021-05-31 ENCOUNTER — Other Ambulatory Visit: Payer: Self-pay | Admitting: Neurology

## 2021-05-31 DIAGNOSIS — R29898 Other symptoms and signs involving the musculoskeletal system: Secondary | ICD-10-CM

## 2021-05-31 DIAGNOSIS — G8311 Monoplegia of lower limb affecting right dominant side: Secondary | ICD-10-CM

## 2021-05-31 DIAGNOSIS — M5000 Cervical disc disorder with myelopathy, unspecified cervical region: Secondary | ICD-10-CM

## 2021-06-13 ENCOUNTER — Encounter (HOSPITAL_COMMUNITY): Payer: Self-pay | Admitting: Emergency Medicine

## 2021-06-13 ENCOUNTER — Emergency Department (HOSPITAL_COMMUNITY): Payer: Medicare Other

## 2021-06-13 ENCOUNTER — Emergency Department (HOSPITAL_COMMUNITY)
Admission: EM | Admit: 2021-06-13 | Discharge: 2021-06-13 | Disposition: A | Discharge status: Home / Self Care | Payer: Medicare Other | Attending: Emergency Medicine | Admitting: Emergency Medicine

## 2021-06-13 DIAGNOSIS — Z79899 Other long term (current) drug therapy: Secondary | ICD-10-CM | POA: Insufficient documentation

## 2021-06-13 DIAGNOSIS — M79604 Pain in right leg: Secondary | ICD-10-CM | POA: Insufficient documentation

## 2021-06-13 DIAGNOSIS — Z87891 Personal history of nicotine dependence: Secondary | ICD-10-CM | POA: Insufficient documentation

## 2021-06-13 DIAGNOSIS — I10 Essential (primary) hypertension: Secondary | ICD-10-CM | POA: Insufficient documentation

## 2021-06-13 DIAGNOSIS — R410 Disorientation, unspecified: Secondary | ICD-10-CM | POA: Diagnosis present

## 2021-06-13 DIAGNOSIS — Z8546 Personal history of malignant neoplasm of prostate: Secondary | ICD-10-CM | POA: Diagnosis not present

## 2021-06-13 DIAGNOSIS — K0889 Other specified disorders of teeth and supporting structures: Secondary | ICD-10-CM | POA: Diagnosis not present

## 2021-06-13 LAB — COMPREHENSIVE METABOLIC PANEL
ALT: 13 U/L (ref 0–44)
AST: 18 U/L (ref 15–41)
Albumin: 4.3 g/dL (ref 3.5–5.0)
Alkaline Phosphatase: 47 U/L (ref 38–126)
Anion gap: 12 (ref 5–15)
BUN: 17 mg/dL (ref 8–23)
CO2: 19 mmol/L — ABNORMAL LOW (ref 22–32)
Calcium: 9.1 mg/dL (ref 8.9–10.3)
Chloride: 109 mmol/L (ref 98–111)
Creatinine, Ser: 0.97 mg/dL (ref 0.61–1.24)
GFR, Estimated: >60 mL/min (ref >60)
Glucose, Bld: 92 mg/dL (ref 70–99)
Potassium: 4.1 mmol/L (ref 3.5–5.1)
Sodium: 140 mmol/L (ref 135–145)
Total Bilirubin: 0.7 mg/dL (ref 0.3–1.2)
Total Protein: 7.5 g/dL (ref 6.5–8.1)

## 2021-06-13 LAB — URINALYSIS, ROUTINE W REFLEX MICROSCOPIC
Bacteria, UA: NONE SEEN
Bilirubin Urine: NEGATIVE
Glucose, UA: NEGATIVE mg/dL
Ketones, ur: NEGATIVE mg/dL
Leukocytes,Ua: NEGATIVE
Nitrite: NEGATIVE
Protein, ur: NEGATIVE mg/dL
Specific Gravity, Urine: 1.01 (ref 1.005–1.030)
pH: 5 (ref 5.0–8.0)

## 2021-06-13 LAB — CBC WITH DIFFERENTIAL/PLATELET
Abs Immature Granulocytes: 0.02 10*3/uL (ref 0.00–0.07)
Basophils Absolute: 0.1 10*3/uL (ref 0.0–0.1)
Basophils Relative: 1 %
Eosinophils Absolute: 0.1 10*3/uL (ref 0.0–0.5)
Eosinophils Relative: 3 %
HCT: 39 % (ref 39.0–52.0)
Hemoglobin: 13.1 g/dL (ref 13.0–17.0)
Immature Granulocytes: 0 %
Lymphocytes Relative: 42 %
Lymphs Abs: 2.4 10*3/uL (ref 0.7–4.0)
MCH: 28.2 pg (ref 26.0–34.0)
MCHC: 33.6 g/dL (ref 30.0–36.0)
MCV: 83.9 fL (ref 80.0–100.0)
Monocytes Absolute: 0.5 10*3/uL (ref 0.1–1.0)
Monocytes Relative: 8 %
Neutro Abs: 2.6 10*3/uL (ref 1.7–7.7)
Neutrophils Relative %: 46 %
Platelets: 240 10*3/uL (ref 150–400)
RBC: 4.65 MIL/uL (ref 4.22–5.81)
RDW: 14.2 % (ref 11.5–15.5)
WBC: 5.6 10*3/uL (ref 4.0–10.5)
nRBC: 0 % (ref 0.0–0.2)

## 2021-06-13 NOTE — Discharge Instructions (Signed)
Your work-up was reassuring today.  Have your MRI as planned and follow-up with neurology

## 2021-06-13 NOTE — ED Triage Notes (Signed)
Pt brought in by wife for concerns of sudden confusion. Pt states this confusion started about 3pm and lasted for a while. Pt states he isn't confused but does want to be seen for his toothache.

## 2021-06-14 NOTE — ED Provider Notes (Signed)
[facility] EMERGENCY DEPARTMENT Provider Note   CSN: [id number] Arrival date & time: 06/13/21  1936     History Chief Complaint  Patient presents with   Altered Mental Status    Joseph Norman is a 75 y.o. male.   Altered Mental Status Presenting symptoms: confusion   Associated symptoms: no abdominal pain   Patient was brought in by family member after reported difficulty with memory.  Reportedly had some confusion.  Reportedly had trouble remembering who some people were and what was going on.  Now doing much better but may be a little confusion.  Started at 3 PM.  Reportedly has had some of his other episodes like this.  Patient states he realized he was little confused but feels fine now.  States he has had episodes like this before.  No new medications.  Does have some pain in her right tooth.  States that is also been going on for a long time.  Has pain in his right leg.  Sees Dr. Merlene Laughter on a scheduled for an MRI of his head and neck.  No chest pain.  No trouble breathing.  History of prostate cancer but no intracranial malignancy    Past Medical History:  Diagnosis Date   Cancer [facility])    Prostate   Dizziness    Hypertension     There are no problems to display for this patient.   Past Surgical History:  Procedure Laterality Date   COLONOSCOPY N/A 06/30/2019   Procedure: COLONOSCOPY;  Surgeon: Daneil Dolin, MD;  Location: [facility] END[facility] SUITE;  Service: Endoscopy;  Laterality: N/A;  12:00   HEMORRHOID SURGERY     NECK SURGERY     PROSTATE SURGERY         Family History  Problem Relation Age of Onset   Colon cancer Neg Hx     Social History   Tobacco Use   Smoking status: Former   Smokeless tobacco: Never  Scientific laboratory technician Use: Never used  Substance Use Topics   Alcohol use: Not Currently   Drug use: Not Currently    Home Medications Prior to Admission medications   Medication Sig Start Date End Date Taking? Authorizing Provider  acetaminophen  (TYLENOL) 500 MG tablet Take 1,000 mg by mouth every 6 (six) hours as needed (for pain/headaches.).   Yes [provider]  Cholecalciferol (VITAMIN D3) 50 MCG (2000 UT) TABS Take 2,000 Units by mouth daily.    Yes [provider]  diclofenac Sodium (VOLTAREN) 1 % GEL Apply 2 g topically See admin instructions. Every 6 to 8 hours   Yes [provider]  meclizine (ANTIVERT) 25 MG tablet Take 25 mg by mouth 3 (three) times daily as needed for dizziness.   Yes [provider]  metoprolol succinate (TOPROL-XL) 25 MG 24 hr tablet Take 1 tablet by mouth daily. 11/07/20  Yes [provider]  Omega-3 Fatty Acids (FISH OIL) 1200 MG CAPS Take 1,200 mg by mouth daily.    Yes [provider]  polyethylene glycol (MIRALAX / GLYCOLAX) 17 g packet Take 17 g by mouth daily as needed (constipation.).    Yes [provider]  acetaminophen (TYLENOL) 650 MG CR tablet Take 1,300 mg by mouth every 8 (eight) hours as needed for pain (pain/headache). Patient not taking: Reported on 06/13/2021    [provider]  cetirizine (ZYRTEC) 5 MG tablet cetirizine 5 mg tablet  TAKE 1 TO 2 TABLETS BY MOUTH EVERY DAY  Patient not taking: Reported on 06/13/2021    [provider]  gabapentin (NEURONTIN) 100 MG capsule gabapentin 100 mg capsule Patient not taking: Reported on 06/13/2021 05/29/21   [provider]  hydrocortisone cream 1 % Apply 1 application topically daily as needed for itching (for chest). Patient not taking: Reported on 06/13/2021    [provider]  metoprolol tartrate (LOPRESSOR) 50 MG tablet Take 25 mg by mouth daily.  Patient not taking: Reported on 06/13/2021    [provider]  oxyCODONE-acetaminophen (PERCOCET) 5-325 MG tablet Take 1 tablet by mouth every 6 (six) hours as needed. Patient not taking: Reported on 06/13/2021 12/07/20   Milton Ferguson, MD  predniSONE (DELTASONE) 20 MG tablet Take 20 mg by  mouth 2 (two) times daily. 1 tab twice daily x5 days Patient not taking: No sig reported 12/04/20   [provider]  tamsulosin (FLOMAX) 0.4 MG CAPS capsule tamsulosin 0.4 mg capsule  TAKE 1 CAPSULE BY MOUTH EVERY DAY FOR KIDNEY STONE Patient not taking: Reported on 06/13/2021    [provider]  tiZANidine (ZANAFLEX) 2 MG tablet tizanidine 2 mg tablet  TAKE 1 TABLET BY MOUTH THREE TIMES DAILY FOR 14 DAYS AS NEEDED Patient not taking: Reported on 06/13/2021    [provider]    Allergies    Asa [aspirin] and Tizanidine  Review of Systems   Review of Systems  Constitutional:  Negative for appetite change.  HENT:  Negative for congestion.   Respiratory:  Negative for shortness of breath.   Cardiovascular:  Negative for chest pain.  Gastrointestinal:  Negative for abdominal pain.  Endocrine: Negative for polydipsia.  Genitourinary:  Negative for flank pain.  Musculoskeletal:  Negative for back pain.  Neurological:  Positive for speech difficulty.  Psychiatric/Behavioral:  Positive for confusion.    Physical Exam Updated Vital Signs BP 139/85   Pulse (!) 59   Temp 98.4 F (36.9 C) (Oral)   Resp 18   Ht 6\' 2"  (1.88 m)   Wt 93 kg   SpO2 98%   BMI 26.32 kg/m   Physical Exam Vitals and nursing note reviewed.  HENT:     Head: Atraumatic.     Mouth/Throat:     Mouth: Mucous membranes are moist.     Comments: Mild tenderness over right upper most posterior tooth.  No deformity.  No large cavity.  No fluctuance or jaw. Eyes:     Extraocular Movements: Extraocular movements intact.     Pupils: Pupils are equal, round, and reactive to light.  Cardiovascular:     Rate and Rhythm: Normal rate and regular rhythm.  Pulmonary:     Effort: Pulmonary effort is normal.  Abdominal:     Tenderness: There is no abdominal tenderness.  Musculoskeletal:        General: No tenderness.     Cervical back: Neck supple.  Skin:    General: Skin is warm.      Capillary Refill: Capillary refill takes less than 2 seconds.  Neurological:     Mental Status: He is alert and oriented to person, place, and time.     Cranial Nerves: No cranial nerve deficit.  Psychiatric:        Mood and Affect: Mood normal.    ED Results / Procedures / Treatments   Labs (all labs ordered are listed, but only abnormal results are displayed) Labs Reviewed  URINALYSIS, ROUTINE W REFLEX MICROSCOPIC - Abnormal; Notable for the following components:  Result Value   Hgb urine dipstick MODERATE (*)    All other components within normal limits  COMPREHENSIVE METABOLIC PANEL - Abnormal; Notable for the following components:   CO2 19 (*)    All other components within normal limits  CBC WITH DIFFERENTIAL/PLATELET    EKG None  Radiology CT Head Wo Contrast  Result Date: 06/13/2021 CLINICAL DATA:  Altered mental status and confusion. EXAM: CT HEAD WITHOUT CONTRAST TECHNIQUE: Contiguous axial images were obtained from the base of the skull through the vertex without intravenous contrast. COMPARISON:  None. FINDINGS: Brain: There is mild-to-moderate cerebral atrophy, small vessel disease and atrophic ventriculomegaly, without midline shift. There is slight cerebellar atrophy with otherwise unremarkable cerebellum and brainstem. No focal asymmetry is seen concerning for an acute infarct, hemorrhage or mass. There are benign dural calcifications along the frontal falx. Vascular: There patchy calcifications in the distal vertebral arteries and both siphons, without hyperdense central vessels. Skull: Intact without visible focal lesion. Sinuses/Orbits: There is mild membrane thickening in the ethmoid air cells. 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 Other: None. IMPRESSION: Chronic changes.  No acute intracranial CT findings. Electronically Signed   By: Telford Nab M.D.   On: 06/13/2021 22:43   DG Chest  Portable 1 View  Result Date: 06/13/2021 CLINICAL DATA:  Weakness. EXAM: PORTABLE CHEST 1 VIEW COMPARISON:  None. FINDINGS: The heart size and mediastinal contours are within normal limits. Both lungs are clear. The visualized skeletal structures are unremarkable. IMPRESSION: No active disease. Electronically Signed   By: Ronney Asters M.D.   On: 06/13/2021 20:55    Procedures Procedures   Medications Ordered in ED Medications - No data to display  ED Course  I have reviewed the triage vital signs and the nursing notes.  Pertinent labs & imaging results that were available during my care of the patient were reviewed by me and considered in my medical decision making (see chart for details).    MDM Rules/Calculators/A&P                          Patient brought in for episodic episodes of some memory issues.  At baseline now.  Patient states he may have been a little confused but only had some dental pain now.  Appears to be at baseline.  Work-up reassuring.  Initially MRI had been ordered since it was planned to be done in a couple days anyway.  However could not get it done this time a day.  CT scan done and overall reassuring.  Some chronic changes.  Lab work does not show a reason for the mental status change.  With him being back at baseline and having short-term neurology follow-up think he is stable for discharge. Final Clinical Impression(s) / ED Diagnoses Final diagnoses:  Confusion    Rx / DC Orders ED Discharge Orders     None        Davonna Belling, MD 06/14/21 1404

## 2021-06-15 ENCOUNTER — Ambulatory Visit (HOSPITAL_COMMUNITY)
Admission: RE | Admit: 2021-06-15 | Discharge: 2021-06-15 | Disposition: A | Discharge status: Home / Self Care | Payer: Medicare Other | Source: Ambulatory Visit | Attending: Neurology | Admitting: Neurology

## 2021-06-15 ENCOUNTER — Other Ambulatory Visit: Payer: Self-pay

## 2021-06-15 DIAGNOSIS — R29898 Other symptoms and signs involving the musculoskeletal system: Secondary | ICD-10-CM | POA: Diagnosis present

## 2021-06-15 DIAGNOSIS — M5000 Cervical disc disorder with myelopathy, unspecified cervical region: Secondary | ICD-10-CM

## 2021-06-15 DIAGNOSIS — G8311 Monoplegia of lower limb affecting right dominant side: Secondary | ICD-10-CM | POA: Insufficient documentation

## 2021-07-13 ENCOUNTER — Other Ambulatory Visit: Payer: Self-pay | Admitting: Family Medicine

## 2021-07-13 ENCOUNTER — Other Ambulatory Visit (HOSPITAL_COMMUNITY): Payer: Self-pay | Admitting: Family Medicine

## 2021-07-13 DIAGNOSIS — M25361 Other instability, right knee: Secondary | ICD-10-CM

## 2021-07-25 ENCOUNTER — Ambulatory Visit (HOSPITAL_COMMUNITY)
Admission: RE | Admit: 2021-07-25 | Discharge: 2021-07-25 | Disposition: A | Discharge status: Home / Self Care | Payer: Medicare Other | Source: Ambulatory Visit | Attending: Family Medicine | Admitting: Family Medicine

## 2021-07-25 ENCOUNTER — Other Ambulatory Visit: Payer: Self-pay

## 2021-07-25 DIAGNOSIS — M25361 Other instability, right knee: Secondary | ICD-10-CM | POA: Insufficient documentation

## 2021-08-20 IMAGING — DX DG PELVIS 1-2V
2 series · 2 of 2 positions shown · non-contrast
Comparison: None.

CLINICAL DATA: Chronic lower back pain radiating down the right leg

EXAM:
PELVIS - 1-2 VIEW

[pelvis ap (1 of 2)]
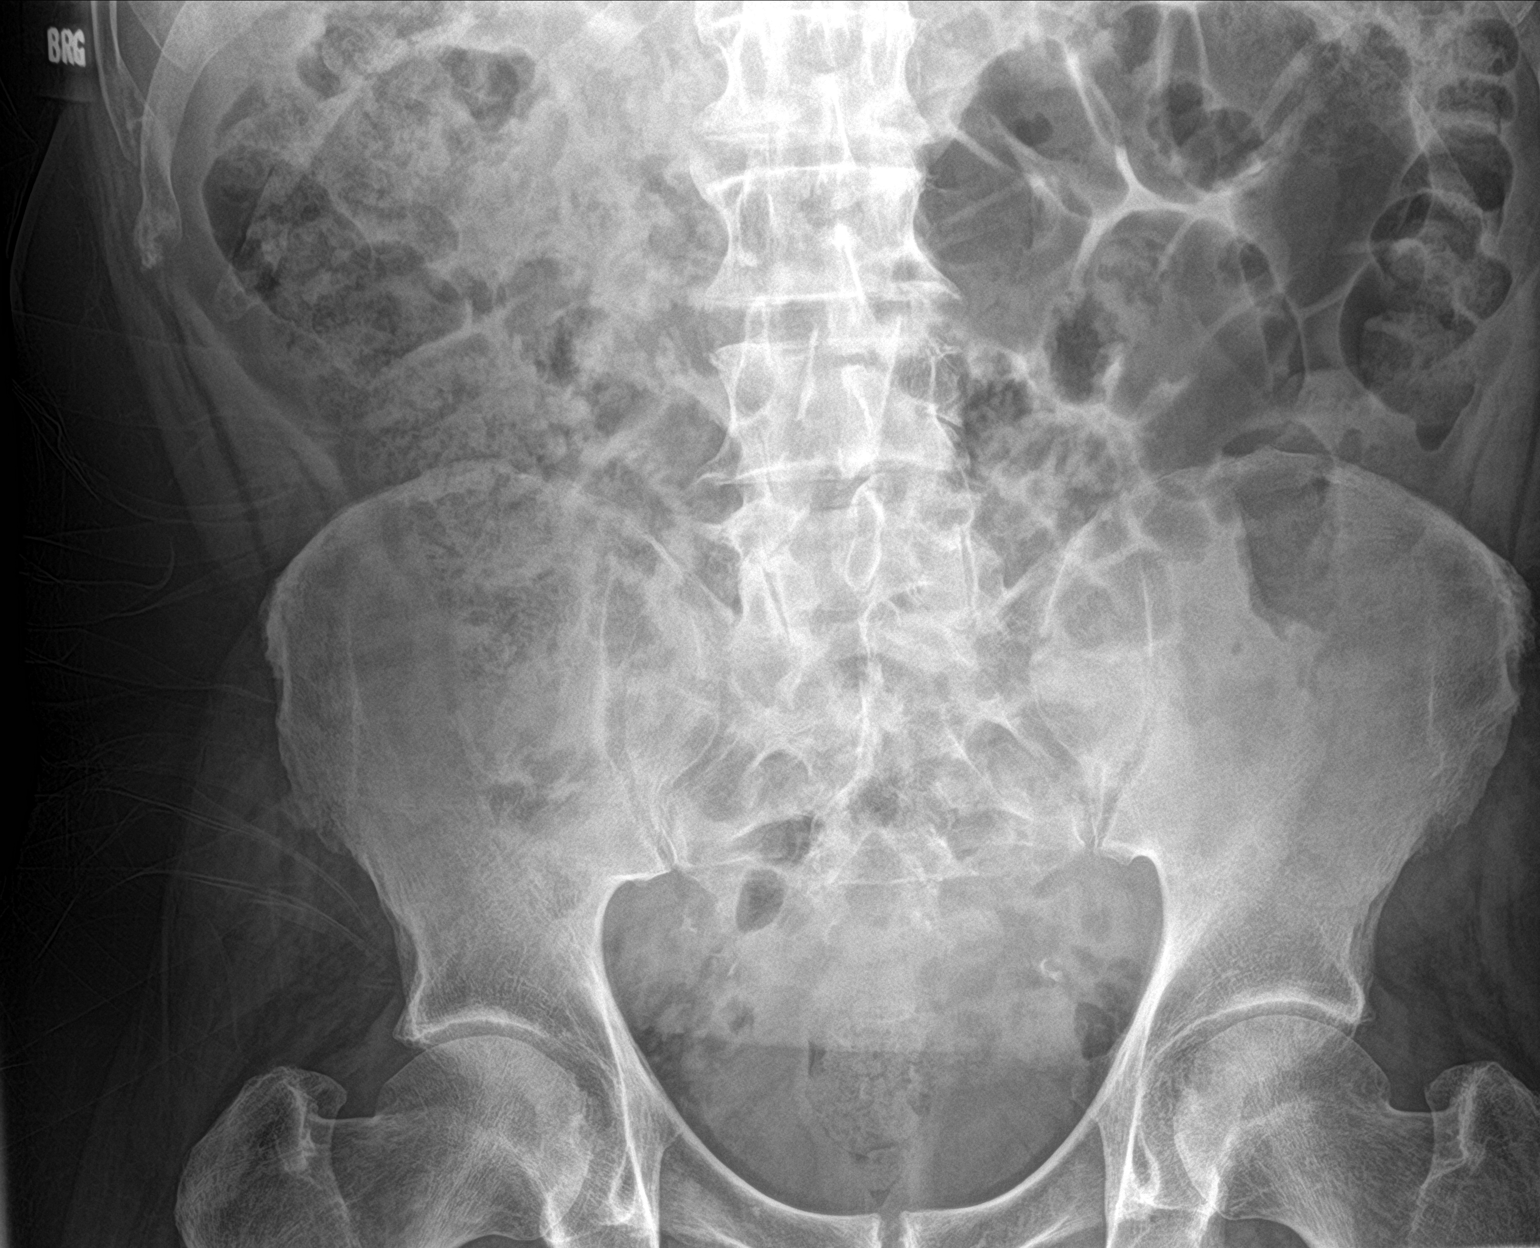

[pelvis ap (2 of 2)]
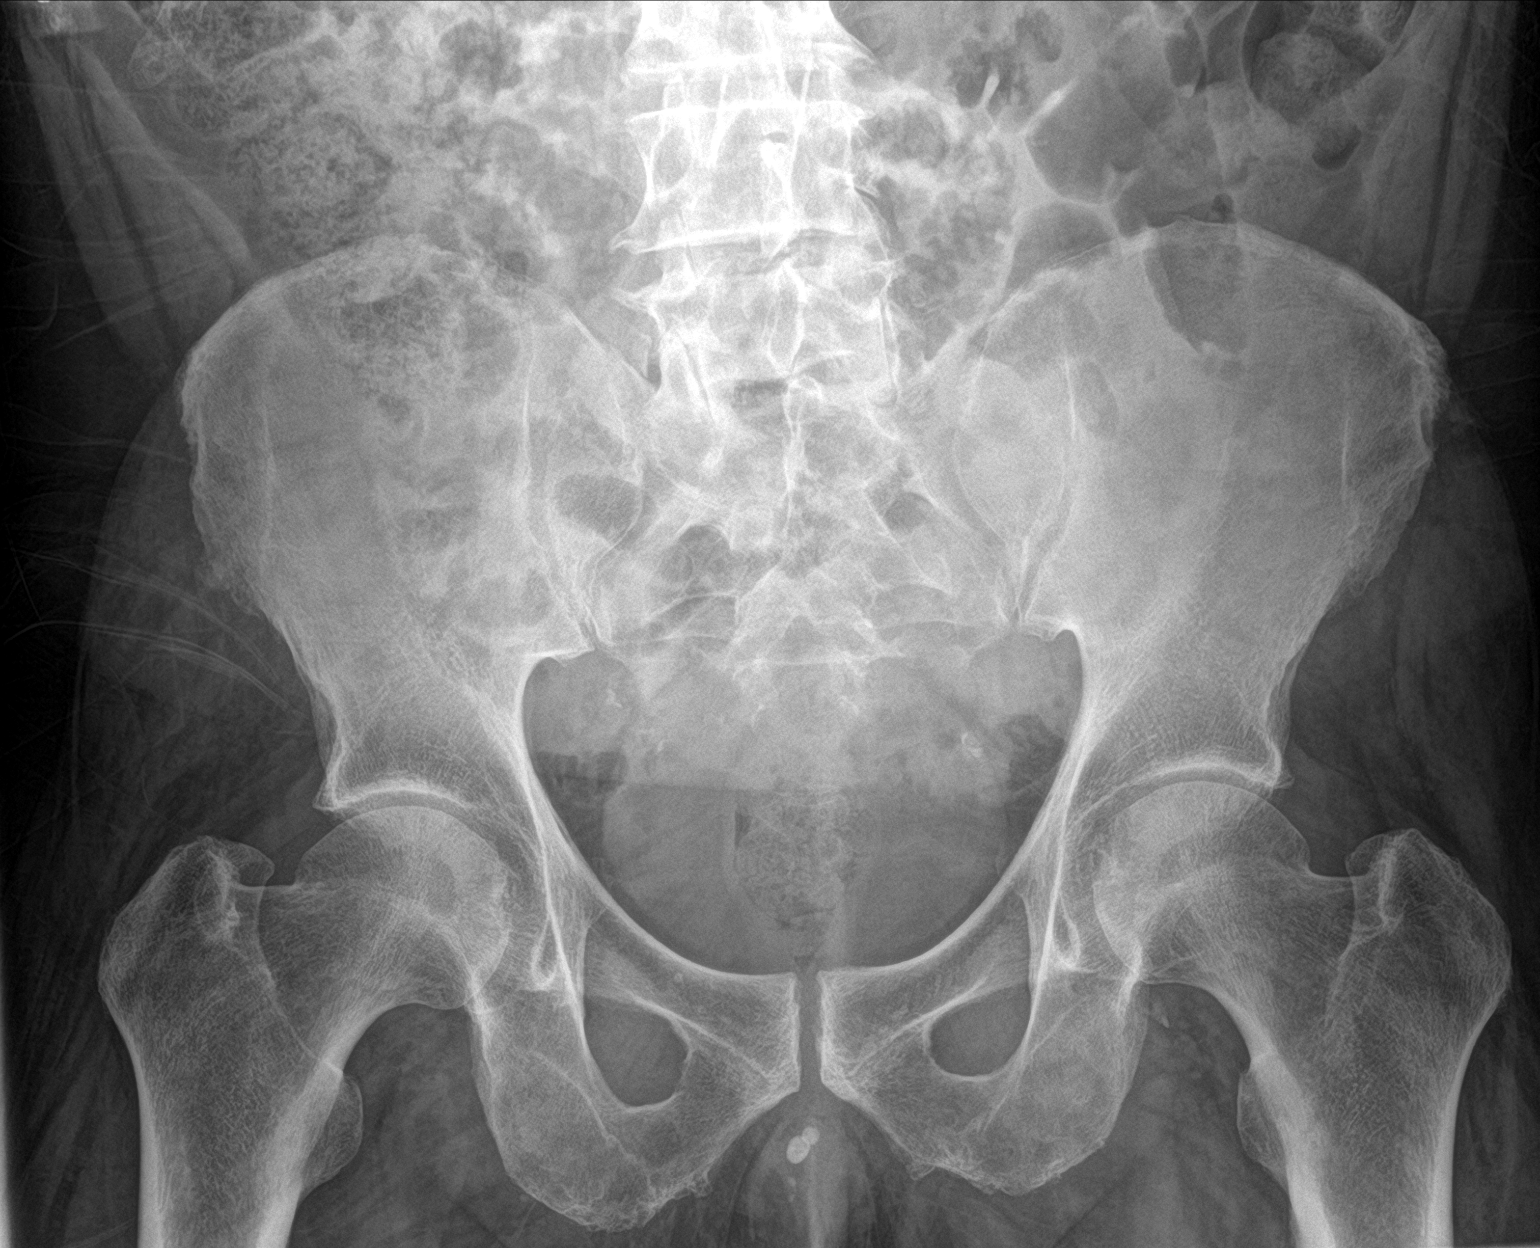

[2 of 2 positions shown; findings below may reference images not displayed]

FINDINGS: There is no evidence of pelvic fracture or diastasis. No pelvic bone
lesions are seen. Lumbar spondylosis.
IMPRESSION: Negative.

## 2022-06-10 ENCOUNTER — Other Ambulatory Visit (HOSPITAL_COMMUNITY): Payer: Self-pay | Admitting: Family Medicine

## 2022-06-10 DIAGNOSIS — M79604 Pain in right leg: Secondary | ICD-10-CM

## 2022-06-14 ENCOUNTER — Ambulatory Visit (HOSPITAL_COMMUNITY)
Admission: RE | Admit: 2022-06-14 | Discharge: 2022-06-14 | Disposition: A | Discharge status: Home / Self Care | Payer: Medicare Other | Source: Ambulatory Visit | Attending: Family Medicine | Admitting: Family Medicine

## 2022-06-14 DIAGNOSIS — M79604 Pain in right leg: Secondary | ICD-10-CM | POA: Insufficient documentation
